# Patient Record
Sex: Male | Born: 1975 | Race: White | Hispanic: No | Marital: Married | Smoking: Never smoker
Health system: Southern US, Community
[De-identification: ages and names within clinical notes are randomized; demographics above are authoritative.]

## PROBLEM LIST (undated history)

## (undated) DIAGNOSIS — F32A Depression, unspecified: Secondary | ICD-10-CM

## (undated) DIAGNOSIS — F329 Major depressive disorder, single episode, unspecified: Secondary | ICD-10-CM

## (undated) DIAGNOSIS — I509 Heart failure, unspecified: Secondary | ICD-10-CM

---

## 2018-08-25 ENCOUNTER — Emergency Department (HOSPITAL_COMMUNITY)
Admission: EM | Admit: 2018-08-25 | Discharge: 2018-08-26 | Disposition: A | Discharge status: Home / Self Care | Payer: BLUE CROSS/BLUE SHIELD | Attending: Emergency Medicine | Admitting: Emergency Medicine

## 2018-08-25 ENCOUNTER — Encounter (HOSPITAL_COMMUNITY): Payer: Self-pay | Admitting: Emergency Medicine

## 2018-08-25 ENCOUNTER — Other Ambulatory Visit: Payer: Self-pay

## 2018-08-25 ENCOUNTER — Emergency Department (HOSPITAL_COMMUNITY): Payer: BLUE CROSS/BLUE SHIELD

## 2018-08-25 DIAGNOSIS — Q0700 Arnold-Chiari syndrome without spina bifida or hydrocephalus: Secondary | ICD-10-CM | POA: Diagnosis not present

## 2018-08-25 DIAGNOSIS — R410 Disorientation, unspecified: Secondary | ICD-10-CM

## 2018-08-25 DIAGNOSIS — I509 Heart failure, unspecified: Secondary | ICD-10-CM | POA: Insufficient documentation

## 2018-08-25 DIAGNOSIS — R4182 Altered mental status, unspecified: Secondary | ICD-10-CM | POA: Diagnosis present

## 2018-08-25 DIAGNOSIS — G935 Compression of brain: Secondary | ICD-10-CM

## 2018-08-25 HISTORY — DX: Heart failure, unspecified: I50.9

## 2018-08-25 HISTORY — DX: Depression, unspecified: F32.A

## 2018-08-25 HISTORY — DX: Major depressive disorder, single episode, unspecified: F32.9

## 2018-08-25 LAB — ETHANOL

## 2018-08-25 LAB — RAPID URINE DRUG SCREEN, HOSP PERFORMED
AMPHETAMINES: NOT DETECTED
BARBITURATES: NOT DETECTED
BENZODIAZEPINES: NOT DETECTED
Cocaine: NOT DETECTED
Opiates: NOT DETECTED
TETRAHYDROCANNABINOL: NOT DETECTED

## 2018-08-25 LAB — COMPREHENSIVE METABOLIC PANEL
ALBUMIN: 4 g/dL (ref 3.5–5.0)
ALK PHOS: 70 U/L (ref 38–126)
ALT: 24 U/L (ref 0–44)
AST: 27 U/L (ref 15–41)
Anion gap: 10 (ref 5–15)
BUN: 10 mg/dL (ref 6–20)
CALCIUM: 9.4 mg/dL (ref 8.9–10.3)
CHLORIDE: 102 mmol/L (ref 98–111)
CO2: 27 mmol/L (ref 22–32)
CREATININE: 1.05 mg/dL (ref 0.61–1.24)
GFR calc non Af Amer: >60 mL/min (ref >60)
GLUCOSE: 100 mg/dL — AB (ref 70–99)
Potassium: 3.9 mmol/L (ref 3.5–5.1)
SODIUM: 139 mmol/L (ref 135–145)
Total Bilirubin: 0.9 mg/dL (ref 0.3–1.2)
Total Protein: 7.1 g/dL (ref 6.5–8.1)

## 2018-08-25 LAB — CBC WITH DIFFERENTIAL/PLATELET
ABS IMMATURE GRANULOCYTES: 0 10*3/uL (ref 0.0–0.1)
Basophils Absolute: 0.1 10*3/uL (ref 0.0–0.1)
Basophils Relative: 0 %
EOS ABS: 0.1 10*3/uL (ref 0.0–0.7)
Eosinophils Relative: 1 %
HEMATOCRIT: 44.5 % (ref 39.0–52.0)
Hemoglobin: 14.3 g/dL (ref 13.0–17.0)
IMMATURE GRANULOCYTES: 0 %
LYMPHS ABS: 1.4 10*3/uL (ref 0.7–4.0)
Lymphocytes Relative: 12 %
MCH: 28 pg (ref 26.0–34.0)
MCHC: 32.1 g/dL (ref 30.0–36.0)
MCV: 87.1 fL (ref 78.0–100.0)
MONO ABS: 1 10*3/uL (ref 0.1–1.0)
MONOS PCT: 8 %
NEUTROS ABS: 9.7 10*3/uL — AB (ref 1.7–7.7)
NEUTROS PCT: 79 %
Platelets: 309 10*3/uL (ref 150–400)
RBC: 5.11 MIL/uL (ref 4.22–5.81)
RDW: 13.6 % (ref 11.5–15.5)
WBC: 12.2 10*3/uL — ABNORMAL HIGH (ref 4.0–10.5)

## 2018-08-25 LAB — URINALYSIS, ROUTINE W REFLEX MICROSCOPIC
BILIRUBIN URINE: NEGATIVE
Glucose, UA: NEGATIVE mg/dL
HGB URINE DIPSTICK: NEGATIVE
KETONES UR: 80 mg/dL — AB
Leukocytes, UA: NEGATIVE
Nitrite: NEGATIVE
PH: 6 (ref 5.0–8.0)
Protein, ur: NEGATIVE mg/dL
Specific Gravity, Urine: 1.019 (ref 1.005–1.030)

## 2018-08-25 LAB — TSH: TSH: 2.425 u[IU]/mL (ref 0.350–4.500)

## 2018-08-25 LAB — AMMONIA: AMMONIA: 27 umol/L (ref 9–35)

## 2018-08-25 LAB — CBG MONITORING, ED: Glucose-Capillary: 100 mg/dL — ABNORMAL HIGH (ref 70–99)

## 2018-08-25 MED ORDER — GADOBUTROL 1 MMOL/ML IV SOLN
10.0000 mL | Freq: Once | INTRAVENOUS | Status: AC | PRN
  Administered 2018-08-25: 10 mL via INTRAVENOUS

## 2018-08-25 MED ORDER — ZOLPIDEM TARTRATE 5 MG PO TABS: 5.0000 mg | ORAL_TABLET | Freq: Every evening | ORAL | Status: DC | PRN

## 2018-08-25 MED ORDER — ACETAMINOPHEN 325 MG PO TABS: 650.0000 mg | ORAL_TABLET | ORAL | Status: DC | PRN

## 2018-08-25 MED ORDER — ONDANSETRON HCL 4 MG PO TABS: 4.0000 mg | ORAL_TABLET | Freq: Three times a day (TID) | ORAL | Status: DC | PRN

## 2018-08-25 MED ORDER — ALUM & MAG HYDROXIDE-SIMETH 200-200-20 MG/5ML PO SUSP: 30.0000 mL | Freq: Four times a day (QID) | ORAL | Status: DC | PRN
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
## 2018-08-25 NOTE — ED Notes (Signed)
ED Provider at bedside. 

## 2018-08-25 NOTE — ED Provider Notes (Signed)
Assumed care of patient at shift change, see previous note for complete history and physical. Pending TTS consult. If TTS clears, follow up with neurology, referral for ambulatory follow up. 5:04 PM TTS recommends stopping the cymbalta, medication adjustment with overnight hold and reassess in the morning.    Physical Exam  BP 125/78   Pulse 87   Temp 98.3 F (36.8 C) (Oral)   Resp 20   Ht 5\' 9"  (1.753 m)   Wt (!) 138.8 kg   SpO2 92%   BMI 45.19 kg/m   Physical Exam  ED Course/Procedures   Clinical Course as of Aug 26 1703  Fri Aug 25, 2018  1665 42 year old male being brought in by his wife for increased personality change and frequent headaches.  He does have a history of depression and his doctors have been trying to adjust his medicines but it does not seem to be helping.  Reportedly had a outpatient CT with some nonspecific findings.  Reviewed with neurology here is recommending an MRI with and without and continued work-up.   [MB]    Clinical Course User Index [MB] Terrilee Files, MD    Procedures  MDM  Patient to move to psych hold for reassessment in the morning.       Jeannie Fend, PA-C 08/25/18 1704    Maia Plan, MD 08/26/18 [phone]

## 2018-08-25 NOTE — ED Notes (Signed)
Patient's wife just arrived, she asks that staff should call her if he has a panic attack

## 2018-08-25 NOTE — ED Triage Notes (Signed)
For 3 weeks pt haas had some confusion, memory loss and depression and withdrawn , has been to dr x 3 and placed on meds testosterone , last night he had increased agitation , feels pending doom was confused and panicky and increased his cymbalta

## 2018-08-25 NOTE — ED Notes (Signed)
Pt ambulated out of room with a steady gait, asking for ice water which was provided.

## 2018-08-25 NOTE — ED Notes (Signed)
Pt CBG 100.  

## 2018-08-25 NOTE — ED Notes (Addendum)
Pt states that he has been having low testosterone, memory loss, and confusion. "Loosing all memory, everything is all mixed up like spaghetti" Family at bedside reports that this started Aug. 16th, wife reports he was late getting kids from school, he was unsure of what he had been doing during that time. "was home from work for acting bizarre". Was seen by PCP and put on testosterone because his was low. Pt had Cymbalta increased to 2X a day. Pt has been leaving home with no recollection of leaving. Pt has since been started on Wellbutrin.

## 2018-08-25 NOTE — Discharge Instructions (Addendum)
Take your Cymbalta 60 mg once daily.  Follow up with Psychiatry.  Get rechecked immediately if you have any new or concerning symptoms.

## 2018-08-25 NOTE — ED Provider Notes (Signed)
[facility] EMERGENCY DEPARTMENT Provider Note   CSN: [id number] Arrival date & time: 08/25/18  0932     History   Chief Complaint Chief Complaint  Patient presents with  . Altered Mental Status    HPI Carlos Fulford. is a 42 y.o. male.  Patient with history of depression on Cymbalta presents the emergency department with several weeks of bizarre behavior and confusion.  Approximately mid August patient began having behavioral changes per his wife.  He became more withdrawn and confused.  He would forget to do certain tasks such as picking up his daughter from school.  He would not remember doing certain things.  He got sent home from his job because he was not acting normally.  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  Symptoms seem to improve for a little while however at the end of August patient was sent home from work again.  He followed up with PCP and had a CT which was negative for tumor but showed some lateral ventricle enlargement and a possible Chiari I malformation.  Patient had his Cymbalta increased to twice a day and was started on Wellbutrin.  Wife thinks that this medication changes made his symptoms worse.  He has been acting more bizarre and was very agitated last night.  He was complaining about being captive in his home.  Wife brought him to the hospital today because she has not been getting any answers and did not have any local psychiatric specialist at their hospital.  Patient has been having daily headaches, typically he will get a migraine approximately once a month. The onset of this condition was acute. The course is waxing and waning. Aggravating factors: none. Alleviating factors: none. Denies substance abuse issues.  No head injury.  Patient denies any toxic exposures at home or at work.        Past Medical History:  Diagnosis Date  . CHF  (congestive heart failure) (HCC)    left ventricular haypertrophy  . Depression     There are no active problems to display for this patient.   The histories are not reviewed yet. Please review them in the "History" navigator section and refresh this SmartLink.      Home Medications    Prior to Admission medications   Medication Sig Start Date End Date Taking? Authorizing Provider  DULoxetine (CYMBALTA) 60 MG capsule Take 60 mg by mouth 2 (two) times daily. 08/17/18   [provider]  testosterone cypionate (DEPOTESTOSTERONE CYPIONATE) 200 MG/ML injection Inject 200 mLs into the muscle once a week. 08/08/18   [provider]    Family History No family history on file.  Social History Social History   Tobacco Use  . Smoking status: Never Smoker  . Smokeless tobacco: Never Used  Substance Use Topics  . Alcohol use: Not Currently  . Drug use: Never     Allergies   Keflex [cephalexin] and Oxycodone   Review of Systems Review of Systems  Constitutional: Negative for fever.  HENT: Negative for rhinorrhea and sore throat.   Eyes: Negative for redness.  Respiratory: Negative for cough.   Cardiovascular: Negative for chest pain.  Gastrointestinal: Negative for abdominal pain, diarrhea, nausea and vomiting.  Genitourinary: Negative for dysuria.  Musculoskeletal: Negative for myalgias.  Skin: Negative for rash.  Neurological: Negative for speech difficulty and headaches.  Psychiatric/Behavioral: Positive for confusion.  Physical Exam Updated Vital Signs BP 109/68   Pulse 92   Temp 98.3 F (36.8 C) (Oral)   Resp (!) 23   Ht 5\' 9"  (1.753 m)   Wt (!) 138.8 kg   SpO2 97%   BMI 45.19 kg/m   Physical Exam  Constitutional: He appears well-developed and well-nourished.  HENT:  Head: Normocephalic and atraumatic.  Right Ear: Tympanic membrane, external ear and ear canal normal.  Left Ear: Tympanic membrane, external ear and ear canal normal.    Nose: Nose normal.  Mouth/Throat: Uvula is midline, oropharynx is clear and moist and mucous membranes are normal.  Eyes: Pupils are equal, round, and reactive to light. Conjunctivae, EOM and lids are normal.  Neck: Normal range of motion. Neck supple.  Cardiovascular: Normal rate and regular rhythm.  No murmur heard. Pulmonary/Chest: Effort normal and breath sounds normal. No respiratory distress. He has no wheezes. He has no rales.  Abdominal: Soft. There is no tenderness.  Musculoskeletal: Normal range of motion.       Cervical back: He exhibits normal range of motion, no tenderness and no bony tenderness.  Neurological: He is alert. He has normal strength and normal reflexes. No cranial nerve deficit or sensory deficit. He exhibits normal muscle tone. He displays a negative Romberg sign. Coordination and gait normal. GCS eye subscore is 4. GCS verbal subscore is 5. GCS motor subscore is 6.  Knows his name, birthday, and address. States date is 08/24/2018. States he is in a hospital, in [place] but takes some time to answer location.  Skin: Skin is warm and dry.  Psychiatric: He has a normal mood and affect.  Nursing note and vitals reviewed.    ED Treatments / Results  Labs (all labs ordered are listed, but only abnormal results are displayed) Labs Reviewed  COMPREHENSIVE METABOLIC PANEL - Abnormal; Notable for the following components:      Result Value   Glucose, Bld 100 (*)    All other components within normal limits  CBC WITH DIFFERENTIAL/PLATELET - Abnormal; Notable for the following components:   WBC 12.2 (*)    Neutro Abs 9.7 (*)    All other components within normal limits  URINALYSIS, ROUTINE W REFLEX MICROSCOPIC - Abnormal; Notable for the following components:   Ketones, ur 80 (*)    All other components within normal limits  CBG MONITORING, ED - Abnormal; Notable for the following components:   Glucose-Capillary 100 (*)    All other components within normal  limits  RAPID URINE DRUG SCREEN, HOSP PERFORMED  ETHANOL  AMMONIA  TSH    EKG None  Radiology Mr Laqueta Jean And Wo Contrast  Result Date: 08/25/2018 CLINICAL DATA:  42 y/o  M; headache, dizziness, memory loss. EXAM: MRI HEAD WITHOUT AND WITH CONTRAST TECHNIQUE: Multiplanar, multiecho pulse sequences of the brain and surrounding structures were obtained without and with intravenous contrast. CONTRAST:  10 cc Gadavist. COMPARISON:  08/11/2018 CT head. FINDINGS: Brain: No acute infarction, hemorrhage, hydrocephalus, extra-axial collection or mass lesion. Mild prominence of the lateral ventricles and small cavum septum pellucidum. No abnormal signal or enhancement of the brain. 7 mm of cerebellar tonsillar ectopia (series 9, image 11). Vascular: Normal flow voids. Skull and upper cervical spine: Normal marrow signal. Sinuses/Orbits: Small mucous retention cysts are present within the bilateral maxillary sinuses. No abnormal signal of mastoid air cells. 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 Orbits are unremarkable. Other: None. 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 Mild prominence of the lateral ventricles for age may be associated with Chiari 1 malformation or represent mild volume loss of the brain. Electronically Signed   By: Mitzi Hansen M.D.   On: 08/25/2018 14:54    Procedures Procedures (including critical care time)  Medications Ordered in ED Medications - No data to display   Initial Impression / Assessment and Plan / ED Course  I have reviewed the triage vital signs and the nursing notes.  Pertinent labs & imaging results that were available during my care of the patient were reviewed by me and considered in my medical decision making (see chart for details).  Clinical Course as of Aug 25 1524  Fri Aug 25, 2018  781 42 year old male being brought in by his wife for increased personality change and  frequent headaches.  He does have a history of depression and his doctors have been trying to adjust his medicines but it does not seem to be helping.  Reportedly had a outpatient CT with some nonspecific findings.  Reviewed with neurology here is recommending an MRI with and without and continued work-up.   [MB]    Clinical Course User Index [MB] Terrilee Files, MD    Patient seen and examined. Work-up initiated. Spoke with Dr. Amada Jupiter for recommendations. Recommends MRI brain with and without and to call back.   Vital signs reviewed and are as follows: BP 125/78   Pulse 87   Temp 98.3 F (36.8 C) (Oral)   Resp 20   Ht 5\' 9"  (1.753 m)   Wt (!) 138.8 kg   SpO2 92%   BMI 45.19 kg/m   3:27 PM Patient and wife updated on results.  Discussed with Dr. Amada Jupiter.  At this time, recommends outpatient neurology referral for neuropsych testing.  Discussed with wife.  She would like TTS evaluation.  Discussed role of TTS evaluation.  Patient agrees.  She states that she is concerned because patient has been telling her that he does not trust her in the room and seems paranoid.  She does not want to leave without psych eval.  This is ordered.  4:06 PM Handoff to [employer] at shift change.   Final Clinical Impressions(s) / ED Diagnoses   Final diagnoses:  Confusion  Chiari malformation type I (HCC)   Pending TTS eval. Pt will need outpatient neuro follow-up.   ED Discharge Orders         Ordered    Ambulatory referral to Neurology    Comments:  An appointment is requested in approximately: 1 week  Patient with behavior changes and confusion, newly noted Chiari malformation, may need neuropsych testing.   08/25/18 1530           Renne Crigler, PA-C 08/25/18 1607    Terrilee Files, MD 08/26/18 1540

## 2018-08-26 DIAGNOSIS — R410 Disorientation, unspecified: Secondary | ICD-10-CM | POA: Diagnosis not present

## 2018-08-26 NOTE — ED Notes (Signed)
Pt wife at bedside, awaiting re assessment.

## 2018-08-26 NOTE — ED Provider Notes (Signed)
Patient here for evaluation of confusion, recent change in his psychiatric medications. He has been observed in the emergency department and is now back to his baseline. He has been evaluated by psychiatry with plan to discharge home, returning to his prior medication dosing. He is non-toxic appearing on examination and calm and appropriate. Plan to discharge home with outpatient follow-up and return precautions.   Tilden Fossa, MD 08/26/18 1013

## 2018-08-26 NOTE — ED Notes (Signed)
Pt awake and eating breakfast, states 'why am I still here?" Pt states 'I think I am here because my testosterone levels dropped too low and it made me lose track of time."

## 2018-08-26 NOTE — Consult Note (Signed)
Telepsych Consultation   Reason for Consult: Altered mental status Referring Physician: Carlisle Cater PA-C Location of Patient:  Location of Provider: [facility]  Patient Identification: Carlos Rosales. MRN:  [id number] Principal Diagnosis: <principal problem not specified> Diagnosis:  There are no active problems to display for this patient.   Total Time spent with patient: 30 minutes  Subjective:   Carlos Geurin. is a 42 y.o. male patient reports today that he is feeling much better.   He states that he feels he is ready to go home and his wife is in the room and she states agreement.  They report that they will follow-up with a psychiatrist and if his symptoms return and they understand that they need to come back to the emergency room.  HPI:  Patient is seen by me today via tele-psych and I have consulted with Dr. Dwyane Dee.  Patient was observed overnight to see if mental status clarity improved.  Patient's wife was in the room for assessment this morning as well, which was a good thing as wife confirmed that patient does seem to be clear thinking today.  Patient did have some questions about things that it happened yesterday and felt like this is been just a dream or nightmare.  Patient denies any SI/HI/AVH and contracts for safety.  Wife states that all of this started when patient started having changes in his medications, but had been on the Cymbalta 60 mg for a very lengthy period of time with no issues.  Suggested that they follow-up with a psychiatrist instead of their PCP and to at a maximum to continue the Cymbalta 60 mg or they may wait until they follow-up with the psychiatrist.  CSW is going to send information over for outpatient resources.  Patient does not meet inpatient criteria and is psychiatrically cleared.   Past Psychiatric History: Depression and anxiety  Risk to Self: Suicidal Ideation: Yes-Currently Present(thoughts, just want to go to sleep  and not wake up) Suicidal Intent: No Is patient at risk for suicide?: No Suicidal Plan?: No Access to Means: No What has been your use of drugs/alcohol within the last 12 months?: patient denies  How many times?: 0 Other Self Harm Risks: none report  Triggers for Past Attempts: None known Intentional Self Injurious Behavior: None Risk to Others: Homicidal Ideation: No Thoughts of Harm to Others: No Current Homicidal Intent: No Current Homicidal Plan: No Access to Homicidal Means: No Identified Victim: n/a History of harm to others?: No Assessment of Violence: None Noted Violent Behavior Description: none noted  Does patient have access to weapons?: No Criminal Charges Pending?: No Does patient have a court date: No Prior Inpatient Therapy: Prior Inpatient Therapy: No Prior Outpatient Therapy: Prior Outpatient Therapy: No Does patient have an ACCT team?: No Does patient have Intensive In-House Services?  : No Does patient have [facility] services? : No Does patient have P4CC services?: No  Past Medical History:  Past Medical History:  Diagnosis Date  . CHF (congestive heart failure) (HCC)    left ventricular haypertrophy  . Depression    Family History: No family history on file. Family Psychiatric  History: Denies Social History:  Social History   Substance and Sexual Activity  Alcohol Use Not Currently     Social History   Substance and Sexual Activity  Drug Use Never    Social History   Socioeconomic History  . Marital status: Married    Spouse name: Not on  file  . Number of children: Not on file  . Years of education: Not on file  . Highest education level: Not on file  Occupational History  . Not on file  Social Needs  . Financial resource strain: Not on file  . Food insecurity:    Worry: Not on file    Inability: Not on file  . Transportation needs:    Medical: Not on file    Non-medical: Not on file  Tobacco Use  . Smoking status: Never Smoker  .  Smokeless tobacco: Never Used  Substance and Sexual Activity  . Alcohol use: Not Currently  . Drug use: Never  . Sexual activity: Not on file  Lifestyle  . Physical activity:    Days per week: Not on file    Minutes per session: Not on file  . Stress: Not on file  Relationships  . Social connections:    Talks on phone: Not on file    Gets together: Not on file    Attends religious service: Not on file    Active member of club or organization: Not on file    Attends meetings of clubs or organizations: Not on file    Relationship status: Not on file  Other Topics Concern  . Not on file  Social History Narrative  . Not on file   Additional Social History:    Allergies:   Allergies  Allergen Reactions  . Keflex [Cephalexin]   . Oxycodone     Labs:  Results for orders placed or performed during the hospital encounter of 08/25/18 (from the past 48 hour(s))  CBG monitoring, ED     Status: Abnormal   Collection Time: 08/25/18 11:02 AM  Result Value Ref Range   Glucose-Capillary 100 (H) 70 - 99 mg/dL   Comment 1 Notify RN    Comment 2 Document in Chart   Comprehensive metabolic panel     Status: Abnormal   Collection Time: 08/25/18 11:09 AM  Result Value Ref Range   Sodium 139 135 - 145 mmol/L   Potassium 3.9 3.5 - 5.1 mmol/L   Chloride 102 98 - 111 mmol/L   CO2 27 22 - 32 mmol/L   Glucose, Bld 100 (H) 70 - 99 mg/dL   BUN 10 6 - 20 mg/dL   Creatinine, Ser 1.05 0.61 - 1.24 mg/dL   Calcium 9.4 8.9 - 10.3 mg/dL   Total Protein 7.1 6.5 - 8.1 g/dL   Albumin 4.0 3.5 - 5.0 g/dL   AST 27 15 - 41 U/L   ALT 24 0 - 44 U/L   Alkaline Phosphatase 70 38 - 126 U/L   Total Bilirubin 0.9 0.3 - 1.2 mg/dL   GFR calc non Af Amer >60 >60 mL/min   GFR calc Af Amer >60 >60 mL/min    Comment: (NOTE) The eGFR has been calculated using the CKD EPI equation. This calculation has not been validated in all clinical situations. eGFR's persistently <60 mL/min signify possible Chronic  Kidney Disease.    Anion gap 10 5 - 15    Comment: Performed at [facility] [address], [place], [place] [address]  CBC WITH DIFFERENTIAL     Status: Abnormal   Collection Time: 08/25/18 11:09 AM  Result Value Ref Range   WBC 12.2 (H) 4.0 - 10.5 K/uL   RBC 5.11 4.22 - 5.81 MIL/uL   Hemoglobin 14.3 13.0 - 17.0 g/dL   HCT 44.5 39.0 - 52.0 %  MCV 87.1 78.0 - 100.0 fL   MCH 28.0 26.0 - 34.0 pg   MCHC 32.1 30.0 - 36.0 g/dL   RDW 13.6 11.5 - 15.5 %   Platelets 309 150 - 400 K/uL   Neutrophils Relative % 79 %   Neutro Abs 9.7 (H) 1.7 - 7.7 K/uL   Lymphocytes Relative 12 %   Lymphs Abs 1.4 0.7 - 4.0 K/uL   Monocytes Relative 8 %   Monocytes Absolute 1.0 0.1 - 1.0 K/uL   Eosinophils Relative 1 %   Eosinophils Absolute 0.1 0.0 - 0.7 K/uL   Basophils Relative 0 %   Basophils Absolute 0.1 0.0 - 0.1 K/uL   Immature Granulocytes 0 %   Abs Immature Granulocytes 0.0 0.0 - 0.1 K/uL    Comment: Performed at [facility] [address] [place], [place] [address]  Urine rapid drug screen (hosp performed)     Status: None   Collection Time: 08/25/18 11:09 AM  Result Value Ref Range   Opiates NONE DETECTED NONE DETECTED   Cocaine NONE DETECTED NONE DETECTED   Benzodiazepines NONE DETECTED NONE DETECTED   Amphetamines NONE DETECTED NONE DETECTED   Tetrahydrocannabinol NONE DETECTED NONE DETECTED   Barbiturates NONE DETECTED NONE DETECTED    Comment: (NOTE) DRUG SCREEN FOR MEDICAL PURPOSES ONLY.  IF CONFIRMATION IS NEEDED FOR ANY PURPOSE, NOTIFY LAB WITHIN 5 DAYS. LOWEST DETECTABLE LIMITS FOR URINE DRUG SCREEN Drug Class                     Cutoff (ng/mL) Amphetamine and metabolites    1000 Barbiturate and metabolites    200 Benzodiazepine                 382 Tricyclics and metabolites     300 Opiates and metabolites        300 Cocaine and metabolites        300 THC                            50 Performed at [facility], [place] [address], [place],  [place] [address]   Ethanol     Status: None   Collection Time: 08/25/18 11:09 AM  Result Value Ref Range   Alcohol, Ethyl (B) <10 <10 mg/dL    Comment: (NOTE) Lowest detectable limit for serum alcohol is 10 mg/dL. For medical purposes only. Performed at [facility], [place] [address], [place], [place] [address]   Ammonia     Status: None   Collection Time: 08/25/18 11:09 AM  Result Value Ref Range   Ammonia 27 9 - 35 umol/L    Comment: Performed at [facility], [place] [address] [place], [place] [address]  Urinalysis, Routine w reflex microscopic     Status: Abnormal   Collection Time: 08/25/18 11:09 AM  Result Value Ref Range   Color, Urine YELLOW YELLOW   APPearance CLEAR CLEAR   Specific Gravity, Urine 1.019 1.005 - 1.030   pH 6.0 5.0 - 8.0   Glucose, UA NEGATIVE NEGATIVE mg/dL   Hgb urine dipstick NEGATIVE NEGATIVE   Bilirubin Urine NEGATIVE NEGATIVE   Ketones, ur 80 (A) NEGATIVE mg/dL   Protein, ur NEGATIVE NEGATIVE mg/dL   Nitrite NEGATIVE NEGATIVE   Leukocytes, UA NEGATIVE NEGATIVE    Comment: Performed at [facility] [address], [place], [place] [address]  TSH  Status: None   Collection Time: 08/25/18 11:09 AM  Result Value Ref Range   TSH 2.425 0.350 - 4.500 uIU/mL    Comment: Performed by a 3rd Generation assay with a functional sensitivity of <=0.01 uIU/mL. Performed at [facility], [place] [address], [place], [place] [address]     Medications:  Current Facility-Administered Medications  Medication Dose Route Frequency Provider Last Rate Last Dose  . acetaminophen (TYLENOL) tablet 650 mg  650 mg Oral Q4H PRN Tacy Learn, PA-C      . alum & mag hydroxide-simeth (MAALOX/MYLANTA) 200-200-20 MG/5ML suspension 30 mL  30 mL Oral Q6H PRN Tacy Learn, PA-C      . ondansetron [facility]) tablet 4 mg  4 mg Oral Q8H PRN Tacy Learn, PA-C      . zolpidem (AMBIEN) tablet 5 mg  5 mg Oral QHS PRN Tacy Learn, PA-C       Current Outpatient  Medications  Medication Sig Dispense Refill  . DULoxetine (CYMBALTA) 60 MG capsule Take 60 mg by mouth 2 (two) times daily.  0  . ibuprofen (ADVIL,MOTRIN) 200 MG tablet Take 400 mg by mouth as needed for moderate pain.    Marland Kitchen testosterone cypionate (DEPOTESTOSTERONE CYPIONATE) 200 MG/ML injection Inject 200 mLs into the muscle once a week.  1    Musculoskeletal: Strength & Muscle Tone: within normal limits Gait & Station: normal Patient leans: N/A  Psychiatric Specialty Exam: Physical Exam  Nursing note and vitals reviewed. Constitutional: He is oriented to person, place, and time. He appears well-developed and well-nourished.  Cardiovascular: Normal rate.  Respiratory: Effort normal.  Musculoskeletal: Normal range of motion.  Neurological: He is alert and oriented to person, place, and time.  Skin: Skin is warm.    Review of Systems  Constitutional: Negative.   HENT: Negative.   Eyes: Negative.   Respiratory: Negative.   Cardiovascular: Negative.   Gastrointestinal: Negative.   Genitourinary: Negative.   Musculoskeletal: Negative.   Skin: Negative.   Neurological: Negative.   Endo/Heme/Allergies: Negative.   Psychiatric/Behavioral: Negative.     Blood pressure 127/79, pulse 69, temperature 97.7 F (36.5 C), temperature source Oral, resp. rate 18, height 5' 9"  (1.753 m), weight (!) 138.8 kg, SpO2 99 %.Body mass index is 45.19 kg/m.  General Appearance: Casual  Eye Contact:  Good  Speech:  Clear and Coherent and Normal Rate  Volume:  Normal  Mood:  Euthymic  Affect:  Appropriate  Thought Process:  Goal Directed and Descriptions of Associations: Intact  Orientation:  Full (Time, Place, and Person)  Thought Content:  WDL  Suicidal Thoughts:  No  Homicidal Thoughts:  No  Memory:  Immediate;   Good Recent;   Poor Remote;   Good  Judgement:  Good  Insight:  Good  Psychomotor Activity:  Normal  Concentration:  Concentration: Good and Attention Span: Good  Recall:  Good   Fund of Knowledge:  Good  Language:  Good  Akathisia:  No  Handed:  Right  AIMS (if indicated):     Assets:  Communication Skills Desire for Improvement Financial Resources/Insurance Housing Physical Health Social Support Transportation  ADL's:  Intact  Cognition:  WNL  Sleep:        Treatment Plan Summary: Follow up with outpatient resources May continue Cymbalta 60 mg daily, but preferred to wait until after her psychiatry follow-up   Disposition: No evidence of imminent risk to self or others at present.   Patient does not meet  criteria for psychiatric inpatient admission. Supportive therapy provided about ongoing stressors. Discussed crisis plan, support from social network, calling 911, coming to the Emergency Department, and calling Suicide Hotline.  This service was provided via telemedicine using a 2-way, interactive audio and video technology.  Names of all persons participating in this telemedicine service and their role in this encounter. Name: Carlos Rosales Role: Patient  Name: Carlos Rosales Role: Patient's wife  Name: Darnelle Maffucci Demyan Fugate Role: Provider  Name:  Role:     Lewis Shock, FNP 08/26/2018 10:01 AM

## 2018-08-26 NOTE — ED Notes (Signed)
ED MD speaking with the patient.

## 2019-04-27 IMAGING — MR MR HEAD WO/W CM
12 of 15 series · 33 of 48 positions shown · IV contrast (Gadavist)
Comparison: 08/11/2018 CT head.

CLINICAL DATA: 42 y/o  M; headache, dizziness, memory loss.

EXAM:
MRI HEAD WITHOUT AND WITH CONTRAST
TECHNIQUE: Multiplanar, multiecho pulse sequences of the brain and surrounding
structures were obtained without and with intravenous contrast.
CONTRAST:  10 cc Gadavist.

[Series 5: DWI · axial · 4.0mm · 0.92mm/px · z∈[-54,+101]mm · 5 of 80 slices shown (1 of 4)]
[im 1/80]
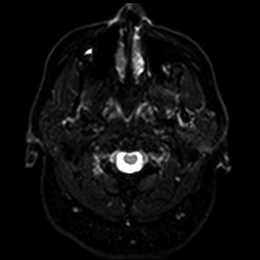
[im 20/80]
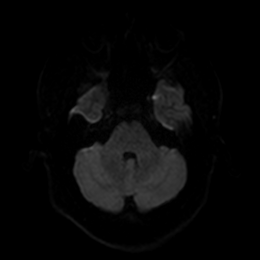
[im 40/80]
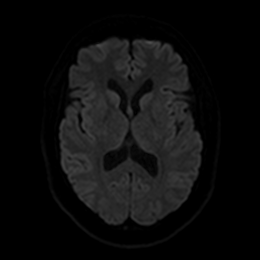
[im 60/80]
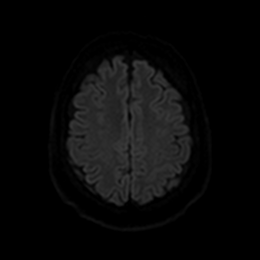
[im 80/80]
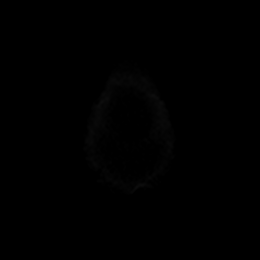

[Series 6: DWI · axial · 4.0mm · 0.92mm/px · z∈[-54,+101]mm · 3 of 40 slices shown (2 of 4)]
[im 1/40]
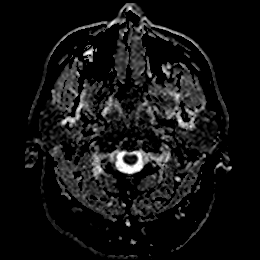
[im 20/40]
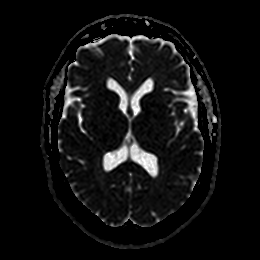
[im 40/40]
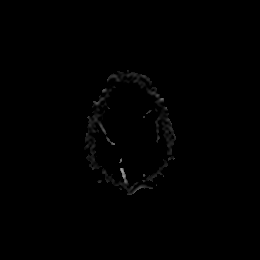

[Series 7: DWI · coronal · 4.0mm · 0.88mm/px · 5 of 78 slices shown (3 of 4)]
[im 1/78]
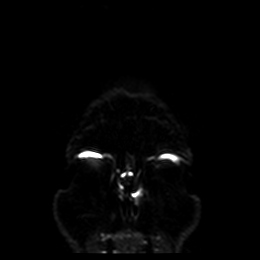
[im 20/78]
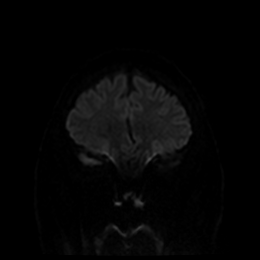
[im 39/78]
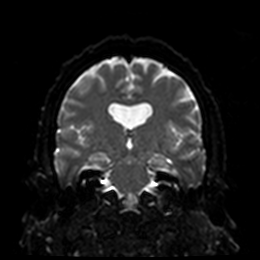
[im 58/78]
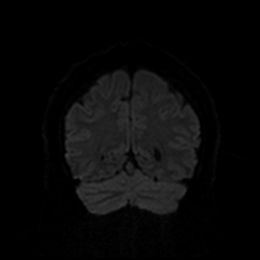
[im 78/78]
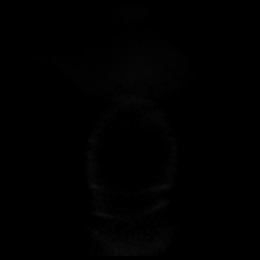

[Series 8: DWI · coronal · 4.0mm · 0.88mm/px · 3 of 39 slices shown (4 of 4)]
[im 1/39]
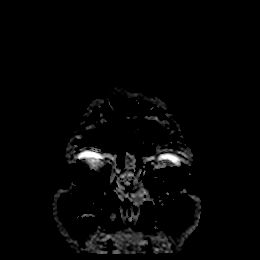
[im 20/39]
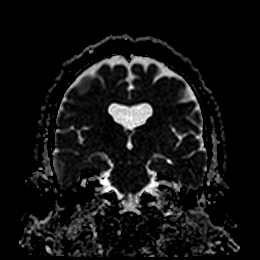
[im 39/39]
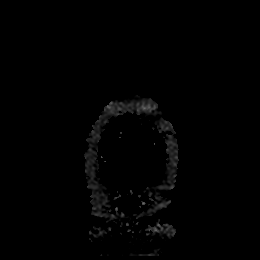

[Series 9: T1 · sagittal · 5.0mm · 0.75mm/px · 1 of 23 slices shown]
[im 1/23]
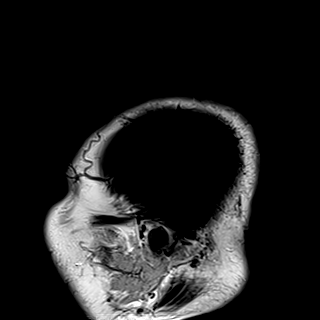

[Series 10: T2 · axial · 5.0mm · 0.81mm/px · z∈[-66,+90]mm · 2 of 27 slices shown]
[im 1/27]
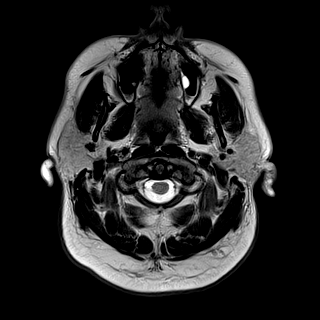
[im 27/27]
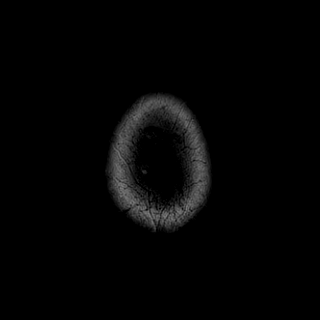

[Series 11: FLAIR · axial · 5.0mm · 0.47mm/px · z∈[-67,+89]mm · 2 of 27 slices shown]
[im 1/27]
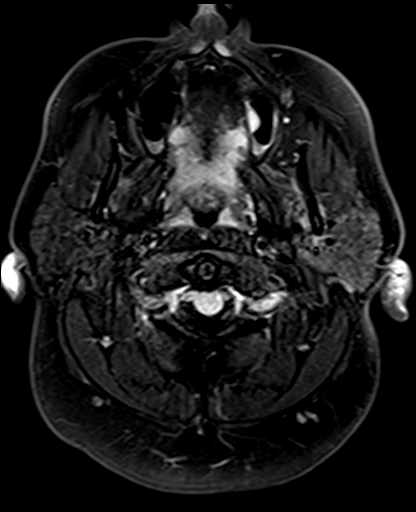
[im 27/27]
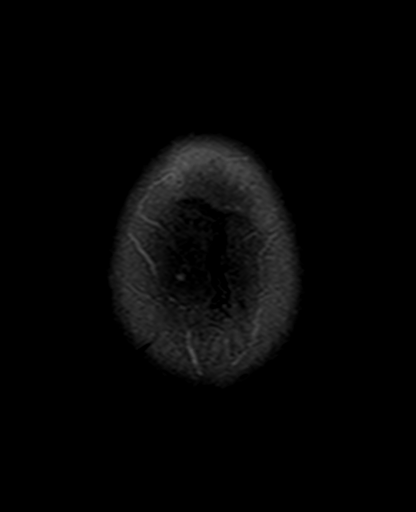

[Series 12: swi_images · axial · 3.0mm · 0.90mm/px · z∈[-75,+90]mm · 4 of 56 slices shown]
[im 1/56]
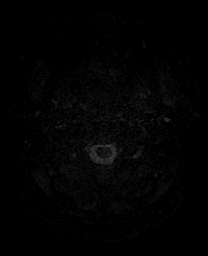
[im 19/56]
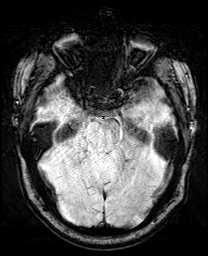
[im 37/56]
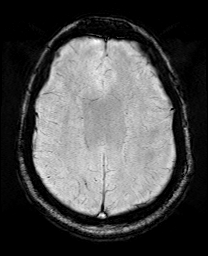
[im 56/56]
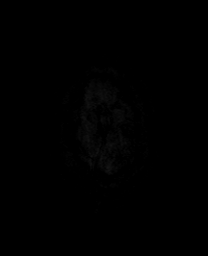

[Series 13: mip_images(sw) · axial · 24.0mm · 0.90mm/px · z∈[-64,+80]mm · 3 of 49 slices shown]
[im 1/49]
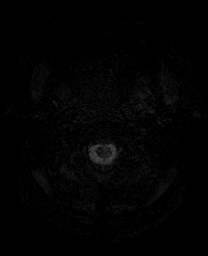
[im 25/49]
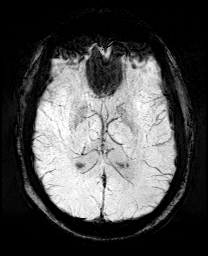
[im 49/49]
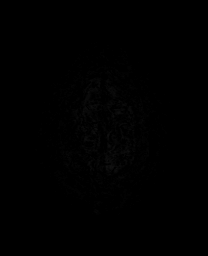

[Series 15: T2 post-contrast · coronal · 5.0mm · 0.72mm/px · 2 of 30 slices shown]
[im 1/30]
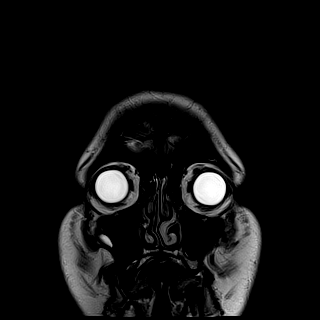
[im 30/30]
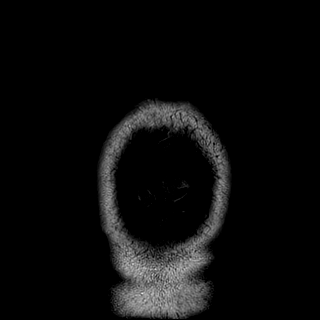

[Series 21: T1 post-contrast · coronal · 5.0mm · 0.34mm/px · 2 of 30 slices shown (1 of 2)]
[im 1/30]
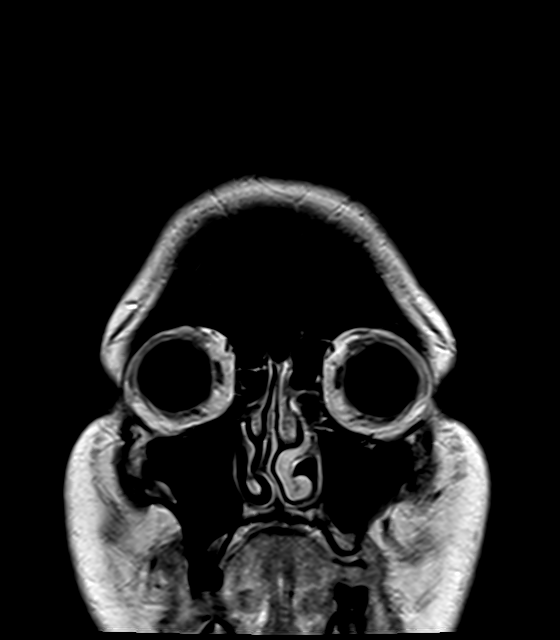
[im 30/30]
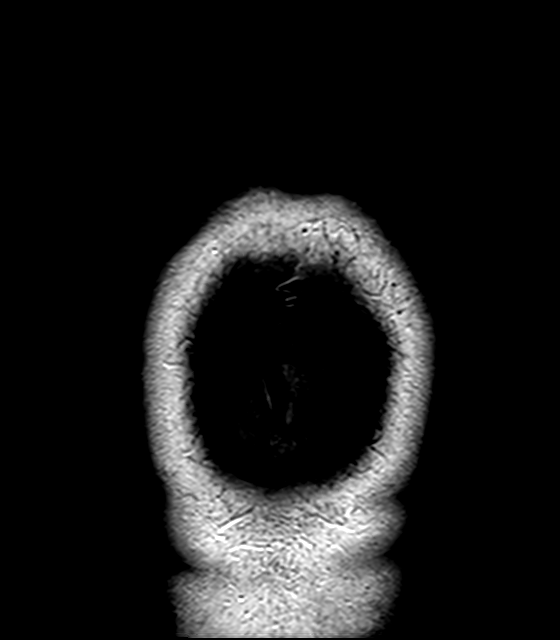

[Series 22: T1 post-contrast · sagittal · 5.0mm · 0.72mm/px · 1 of 23 slices shown (2 of 2)]
[im 1/23]
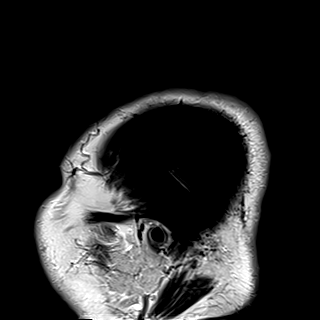

[33 of 48 positions shown; findings below may reference images not displayed]

FINDINGS: Brain: No acute infarction, hemorrhage, hydrocephalus, extra-axial
collection or mass lesion. Mild prominence of the lateral ventricles
and small cavum septum pellucidum. No abnormal signal or enhancement
of the brain. 7 mm of cerebellar tonsillar ectopia (series 9, image
11).

Vascular: Normal flow voids.

Skull and upper cervical spine: Normal marrow signal.

Sinuses/Orbits: Small mucous retention cysts are present within the
bilateral maxillary sinuses. No abnormal signal of mastoid air
cells. 10 mm cyst within the adenoid tonsils, likely Thornwaldt
cyst. Orbits are unremarkable.

Other: None.
IMPRESSION: 7 mm cerebellar tonsillar ectopia compatible with Chiari 1
malformation. Mild prominence of the lateral ventricles for age may
be associated with Chiari 1 malformation or represent mild volume
loss of the brain.

By: Babyblack W Fagardo M.D.

## 2025-01-08 ENCOUNTER — Ambulatory Visit: Admitting: Nurse Practitioner

## 2025-01-08 ENCOUNTER — Encounter: Payer: Self-pay | Admitting: Nurse Practitioner

## 2025-01-08 VITALS — BP 147/86 | HR 101 | Temp 97.9°F | Ht 69.0 in | Wt 368.0 lb

## 2025-01-08 DIAGNOSIS — R7303 Prediabetes: Secondary | ICD-10-CM | POA: Diagnosis not present

## 2025-01-08 DIAGNOSIS — G4733 Obstructive sleep apnea (adult) (pediatric): Secondary | ICD-10-CM | POA: Diagnosis not present

## 2025-01-08 DIAGNOSIS — E66813 Obesity, class 3: Secondary | ICD-10-CM

## 2025-01-08 DIAGNOSIS — Z6841 Body Mass Index (BMI) 40.0 and over, adult: Secondary | ICD-10-CM

## 2025-01-08 NOTE — Progress Notes (Signed)
 " Office: [phone]  /  Fax: [phone]   Initial Visit  Carlos Rosales. was seen in clinic today to evaluate for obesity. He is interested in losing weight to improve overall health and reduce the risk of weight related complications. He presents today to review program treatment options, initial physical assessment, and evaluation.     He was referred by: Friend or Family  When asked what else they would like to accomplish? He states: Adopt a healthier eating pattern and lifestyle, Improve energy levels and physical activity, Improve existing medical conditions, and Improve quality of life  Weight history:  he started gaining weight in high school.  He has gained 80 lbs over the past 2 years when he stopped working. He stopped working due to back pain.   When asked how has your weight affected you? He states: Contributed to medical problems, Contributed to orthopedic problems or mobility issues, Having fatigue, and Having poor endurance  Some associated conditions: OSA and Prediabetes, schizophrenia   Contributing factors: family history of obesity, chronic skipping of meals, and sedentary job  Weight promoting medications identified: None  Current nutrition plan: None  Current level of physical activity: going to the gym 5 days per week-bike 25 mins and 30 min circuit at [employer]  Current or previous pharmacotherapy: None  Response to medication: Never tried medications   Past medical history includes:   Past Medical History:  Diagnosis Date   CHF (congestive heart failure) (HCC)    left ventricular haypertrophy   Depression      Objective:   BP (!) 147/86   Pulse (!) 101   Temp 97.9 F (36.6 C)   Ht 5' 9 (1.753 m)   Wt (!) 368 lb (166.9 kg)   SpO2 95%   BMI 54.34 kg/m  He was weighed on the bioimpedance scale: Body mass index is 54.34 kg/m.  Peak Weight: 373 lbs , Body Fat%:45.5%, Visceral Fat Rating:35, Weight trend over the last 12 months:  Increasing  General:  Alert, oriented and cooperative. Patient is in no acute distress.  Respiratory: Normal respiratory effort, no problems with respiration noted   Gait: able to ambulate independently  Mental Status: Normal mood and affect. Normal behavior. Normal judgment and thought content.   DIAGNOSTIC DATA REVIEWED:  BMET    Component Value Date/Time   NA 139 08/25/2018 1109   K 3.9 08/25/2018 1109   CL 102 08/25/2018 1109   CO2 27 08/25/2018 1109   GLUCOSE 100 (H) 08/25/2018 1109   BUN 10 08/25/2018 1109   CREATININE 1.05 08/25/2018 1109   CALCIUM 9.4 08/25/2018 1109   GFRNONAA >60 08/25/2018 1109   GFRAA >60 08/25/2018 1109   No results found for: HGBA1C No results found for: INSULIN CBC    Component Value Date/Time   WBC 12.2 (H) 08/25/2018 1109   RBC 5.11 08/25/2018 1109   HGB 14.3 08/25/2018 1109   HCT 44.5 08/25/2018 1109   PLT 309 08/25/2018 1109   MCV 87.1 08/25/2018 1109   MCH 28.0 08/25/2018 1109   MCHC 32.1 08/25/2018 1109   RDW 13.6 08/25/2018 1109   Iron/TIBC/Ferritin/ %Sat No results found for: IRON, TIBC, FERRITIN, IRONPCTSAT Lipid Panel  No results found for: CHOL, TRIG, HDL, CHOLHDL, VLDL, LDLCALC, LDLDIRECT Hepatic Function Panel     Component Value Date/Time   PROT 7.1 08/25/2018 1109   ALBUMIN 4.0 08/25/2018 1109   AST 27 08/25/2018 1109   ALT 24 08/25/2018 1109   ALKPHOS  70 08/25/2018 1109   BILITOT 0.9 08/25/2018 1109      Component Value Date/Time   TSH 2.425 08/25/2018 1109     Assessment and Plan:   Obstructive sleep apnea syndrome Patient is not currently using CPAP.  Reports sleep study was years ago.  Discussed referred for sleep apnea. Patient is willing to proceed with referral.  Will re discuss at next 1-2 visits.    Pre-diabetes Will obtain fasting labs at next visit.   Class 3 severe obesity due to excess calories with body mass index (BMI) of 50.0 to 59.9 in adult, unspecified whether  serious comorbidity present [facility])        Obesity Treatment / Action Plan:  Patient will work on garnering support from family and friends to begin weight loss journey. Will work on eliminating or reducing the presence of highly palatable, calorie dense foods in the home. Will complete provided nutritional and psychosocial assessment questionnaire before the next appointment. Will be scheduled for indirect calorimetry to determine resting energy expenditure in a fasting state.  This will allow us  to create a reduced calorie, high-protein meal plan to promote loss of fat mass while preserving muscle mass. Counseled on the health benefits of losing 5%-15% of total body weight. Was counseled on nutritional approaches to weight loss and benefits of reducing processed foods and consuming plant-based foods and high quality protein as part of nutritional weight management. Was counseled on pharmacotherapy and role as an adjunct in weight management.   Obesity Education Performed Today:  He was weighed on the bioimpedance scale and results were discussed and documented in the synopsis.  We discussed obesity as a disease and the importance of a more detailed evaluation of all the factors contributing to the disease.  We discussed the importance of long term lifestyle changes which include nutrition, exercise and behavioral modifications as well as the importance of customizing this to his specific health and social needs.  We discussed the benefits of reaching a healthier weight to alleviate the symptoms of existing conditions and reduce the risks of the biomechanical, metabolic and psychological effects of obesity.  Carlos F Biffle Jr. appears to be in the action stage of change and states they are ready to start intensive lifestyle modifications and behavioral modifications.  30 minutes was spent today on this visit including the above counseling, pre-visit chart review, and post-visit  documentation.  Reviewed by clinician on day of visit: allergies, medications, problem list, medical history, surgical history, family history, social history, and previous encounter notes pertinent to obesity diagnosis.    Carlos SAUNDERS Shenell Rogalski FNP-C   "

## 2025-01-23 ENCOUNTER — Institutional Professional Consult (permissible substitution) (INDEPENDENT_AMBULATORY_CARE_PROVIDER_SITE_OTHER): Admitting: Family Medicine

## 2025-01-30 ENCOUNTER — Ambulatory Visit: Admitting: Family Medicine

## 2025-02-13 ENCOUNTER — Ambulatory Visit: Admitting: Family Medicine
